# Patient Record
Sex: Male | Born: 1955
Health system: Southern US, Community
[De-identification: ages and names within clinical notes are randomized; demographics above are authoritative.]

## PROBLEM LIST (undated history)

## (undated) HISTORY — PX: CHOLECYSTECTOMY: SHX55

---

## 2009-07-22 ENCOUNTER — Ambulatory Visit: Payer: Self-pay | Admitting: Family Medicine

## 2010-12-22 IMAGING — CR CERVICAL SPINE - COMPLETE 4+ VIEW
1 series · 7 of 7 positions shown · non-contrast
Comparison: none

REASON FOR EXAM: parathesia  lt hand tingling
COMMENTS:

[Series 1: view not recorded · 0.17mm/px · 7 of 7 slices shown]
[im 1/7]
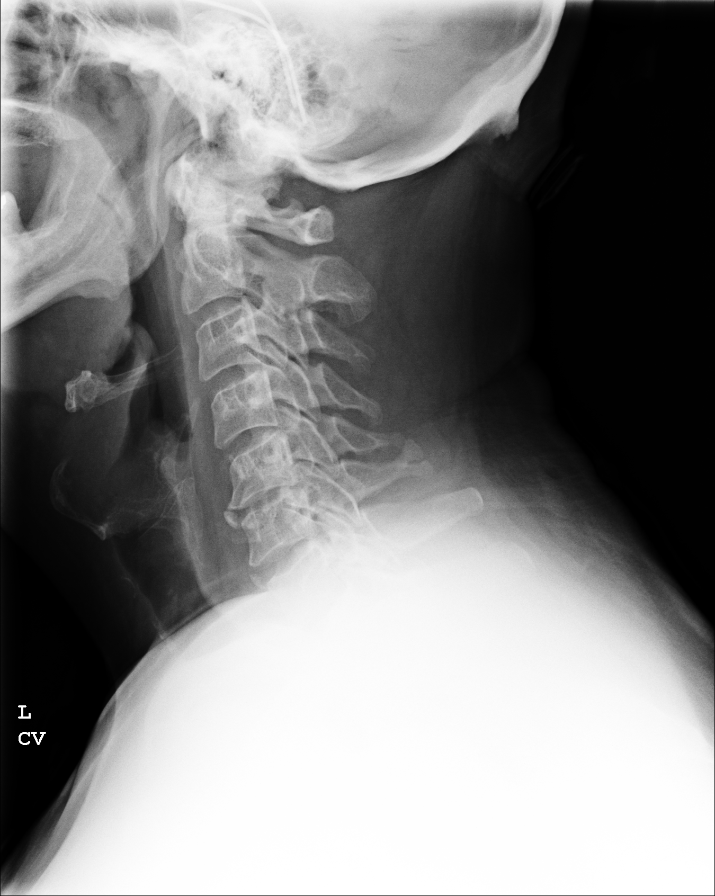
[im 2/7]
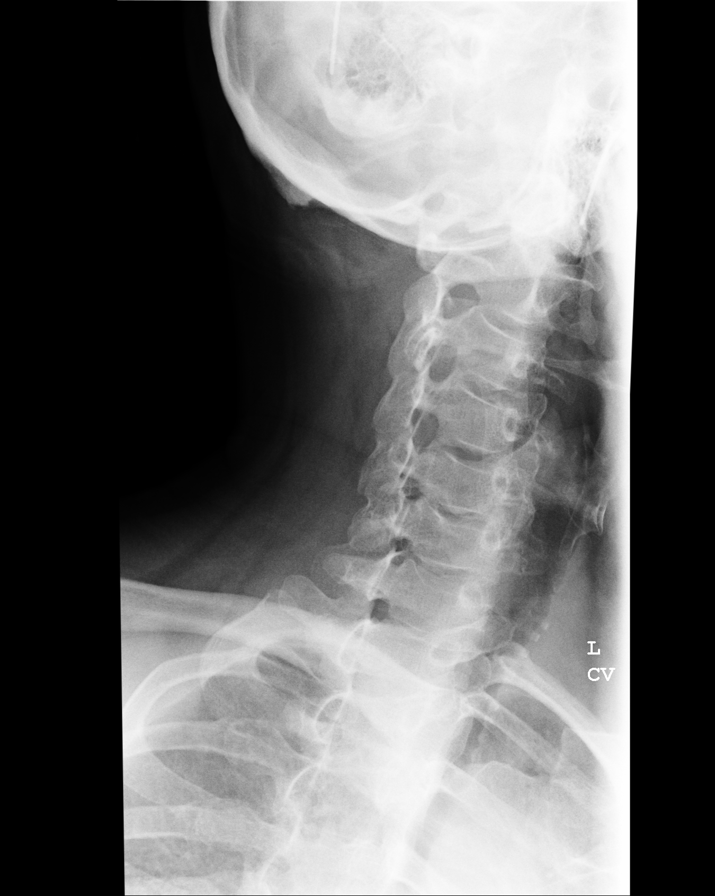
[im 3/7]
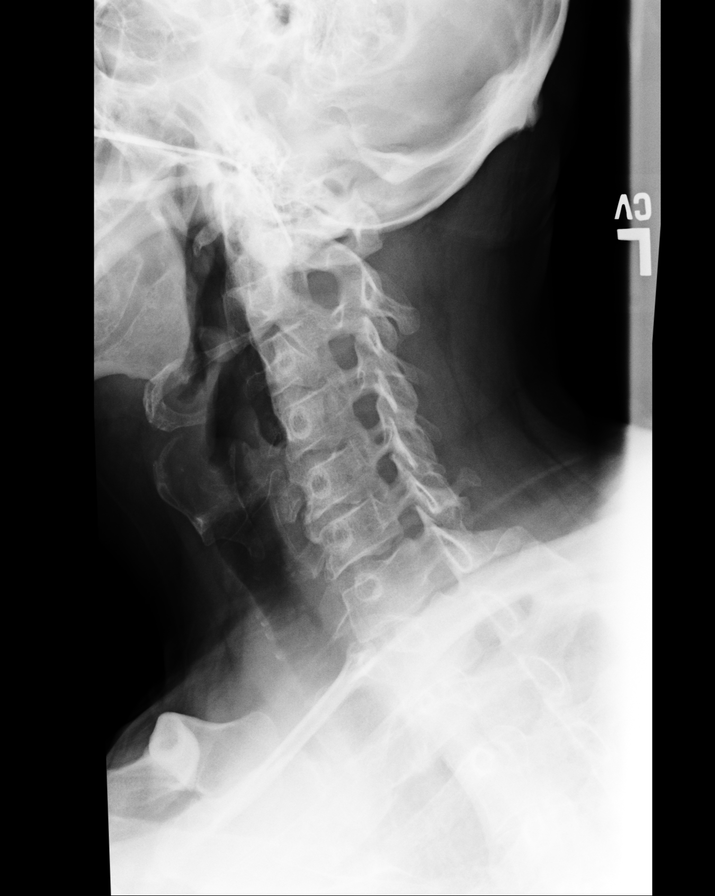
[im 4/7]
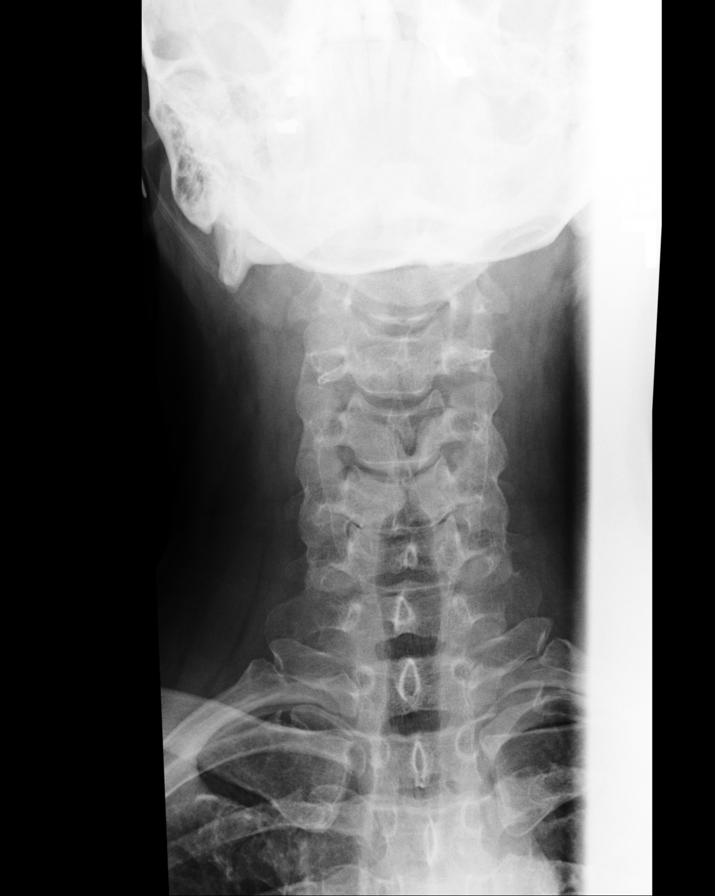
[im 5/7]
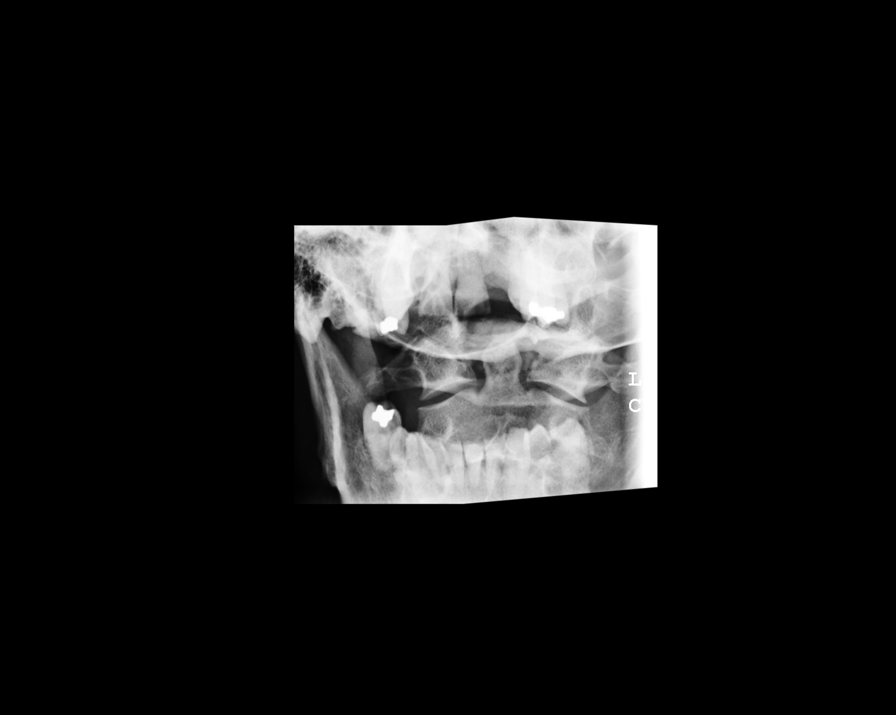
[im 6/7]
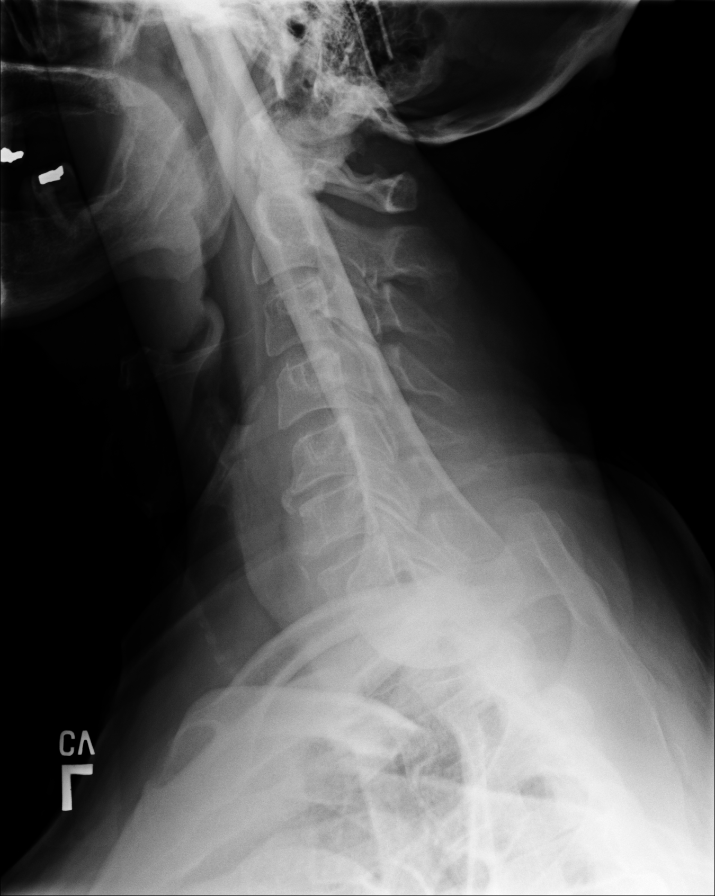
[im 7/7]
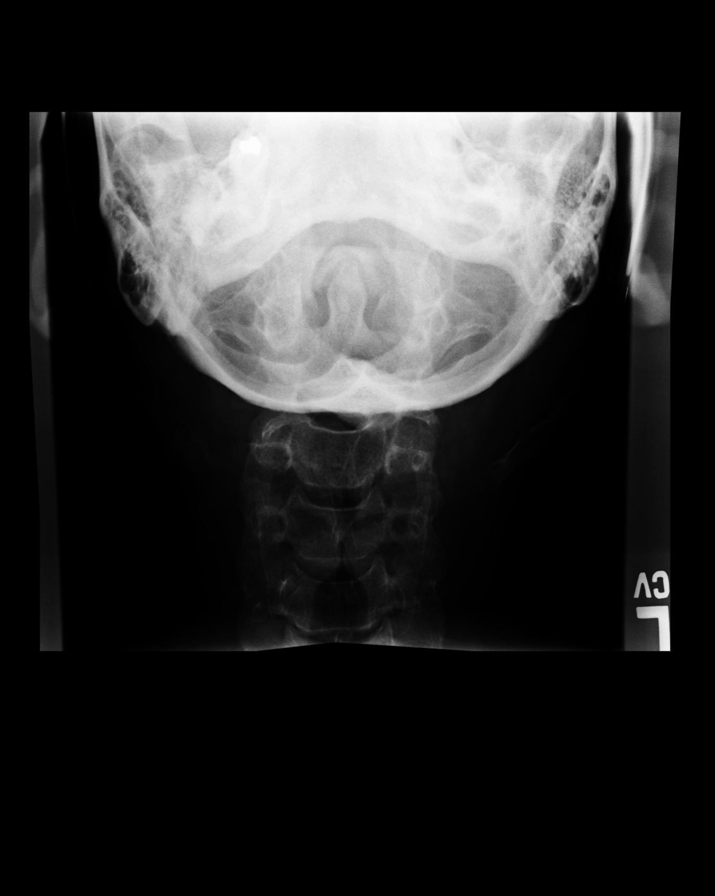

[7 of 7 positions shown; findings below may reference images not displayed]

PROCEDURE:     MDR - MDR CERVICAL SPINE COMPLETE  - July 22, 2009 [DATE]

RESULT:     The cervical vertebral bodies are preserved in height. There is
mild disc space narrowing at C5-C6 and at C6-C7. There are small anterior
osteophytes at these levels. The oblique views reveal no high-grade bony
encroachment upon the neural foramina. The prevertebral soft tissue spaces
are normal. The spinous processes are intact. The lateral masses of C1 align
normally with those of C2. The odontoid is intact.
IMPRESSION: There is mild degenerative disc change at C5-C6 and to a
lesser extent at C6-C7. I do not see evidence of acute fracture nor
dislocation.

## 2011-10-22 ENCOUNTER — Ambulatory Visit: Payer: Self-pay | Admitting: Family Medicine

## 2012-05-02 ENCOUNTER — Ambulatory Visit: Payer: Self-pay | Admitting: Family Medicine

## 2013-03-23 IMAGING — CR DG LUMBAR SPINE COMPLETE 4+V
1 series · 5 of 5 positions shown · non-contrast
Comparison: none

REASON FOR EXAM: lower back pain
COMMENTS:

[Series 1: ap · 0.17mm/px · 5 of 5 slices shown]
[im 1/5]
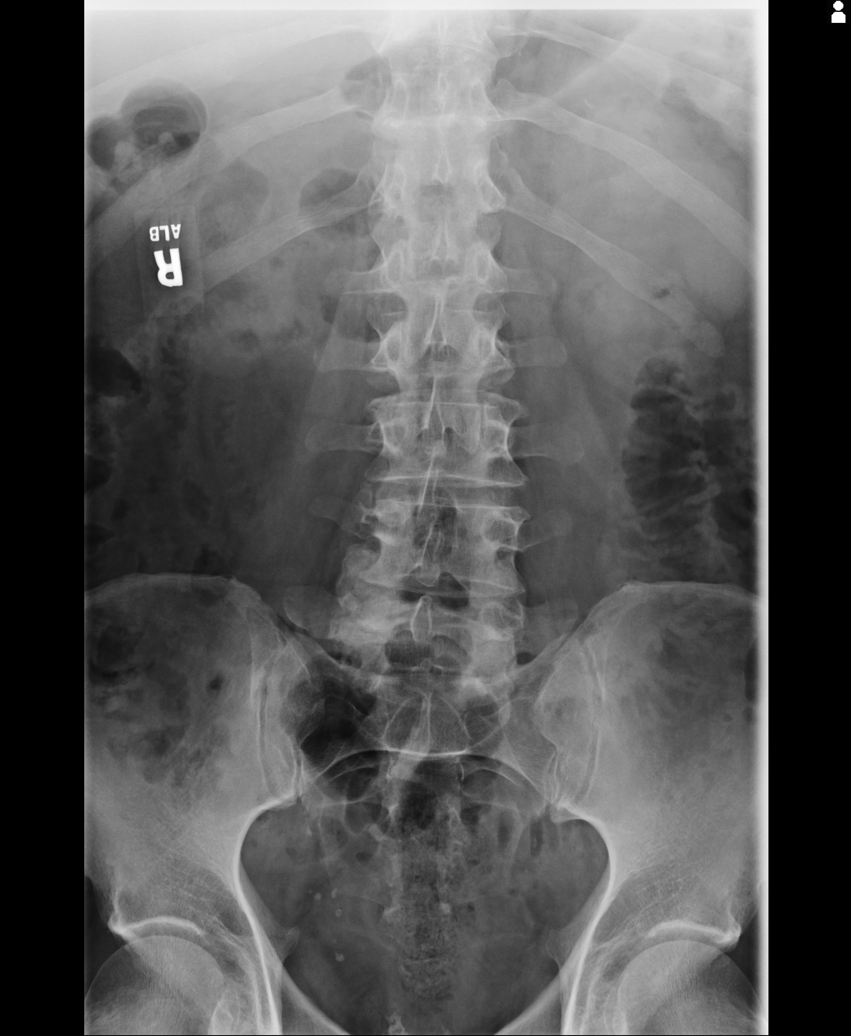
[im 2/5]
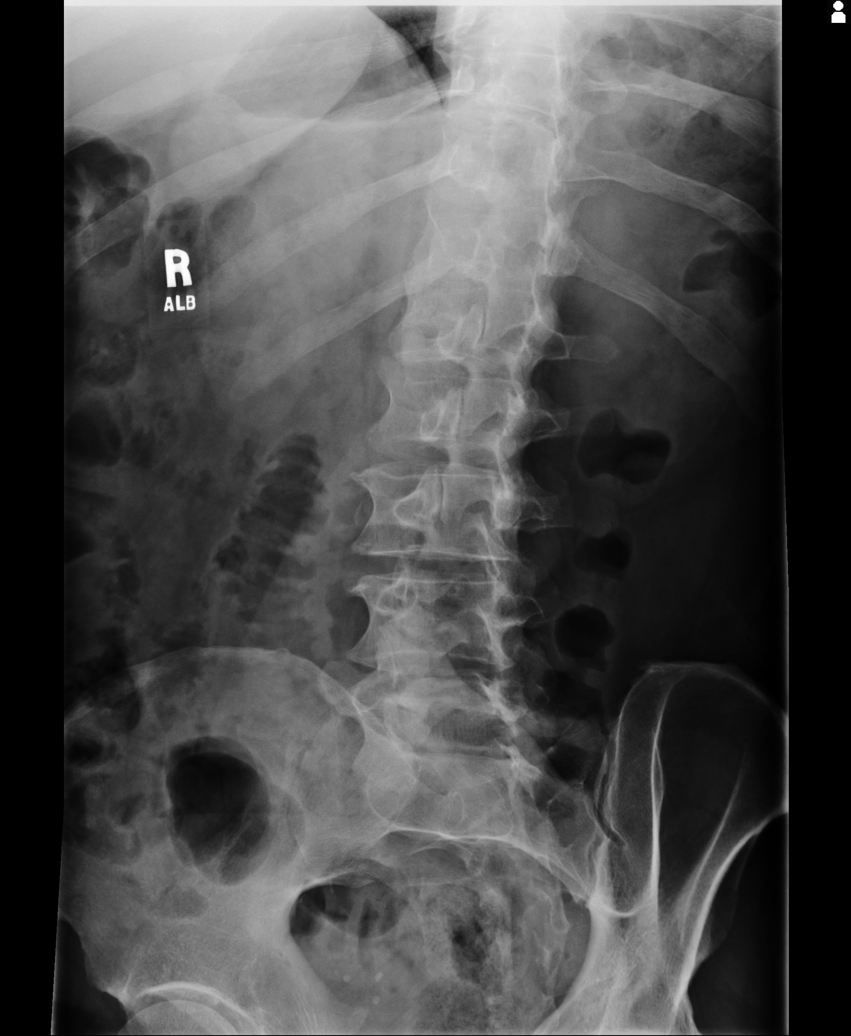
[im 3/5]
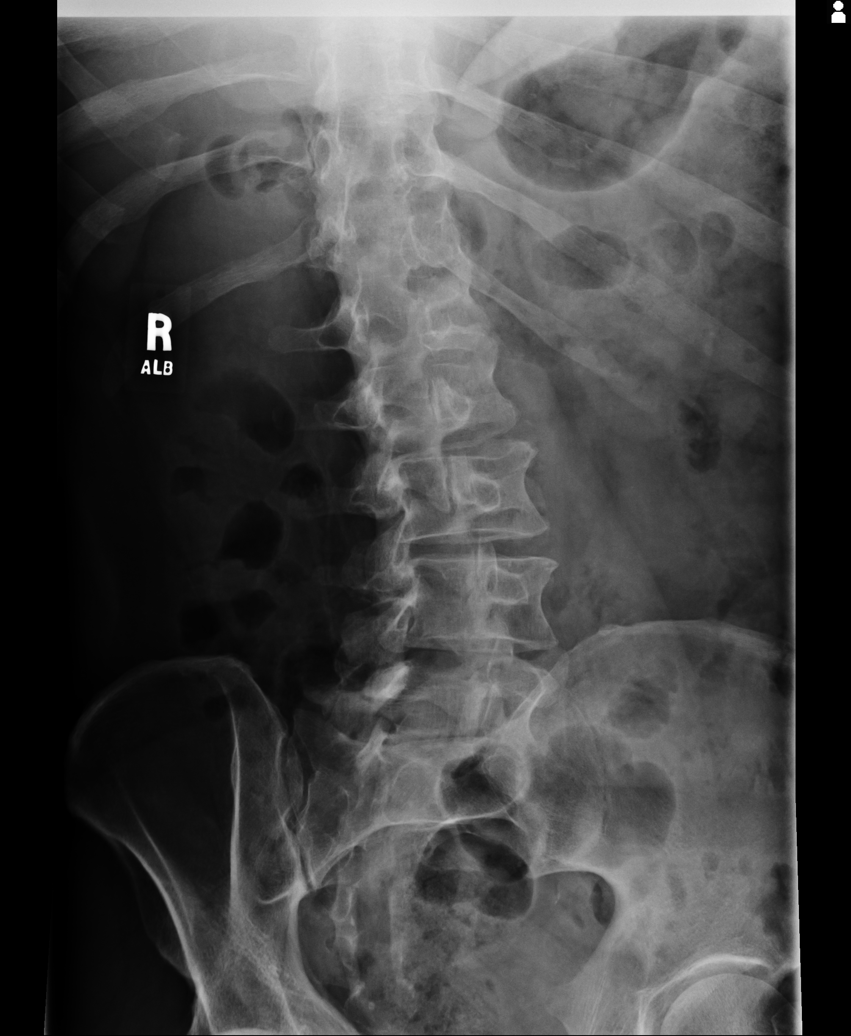
[im 4/5]
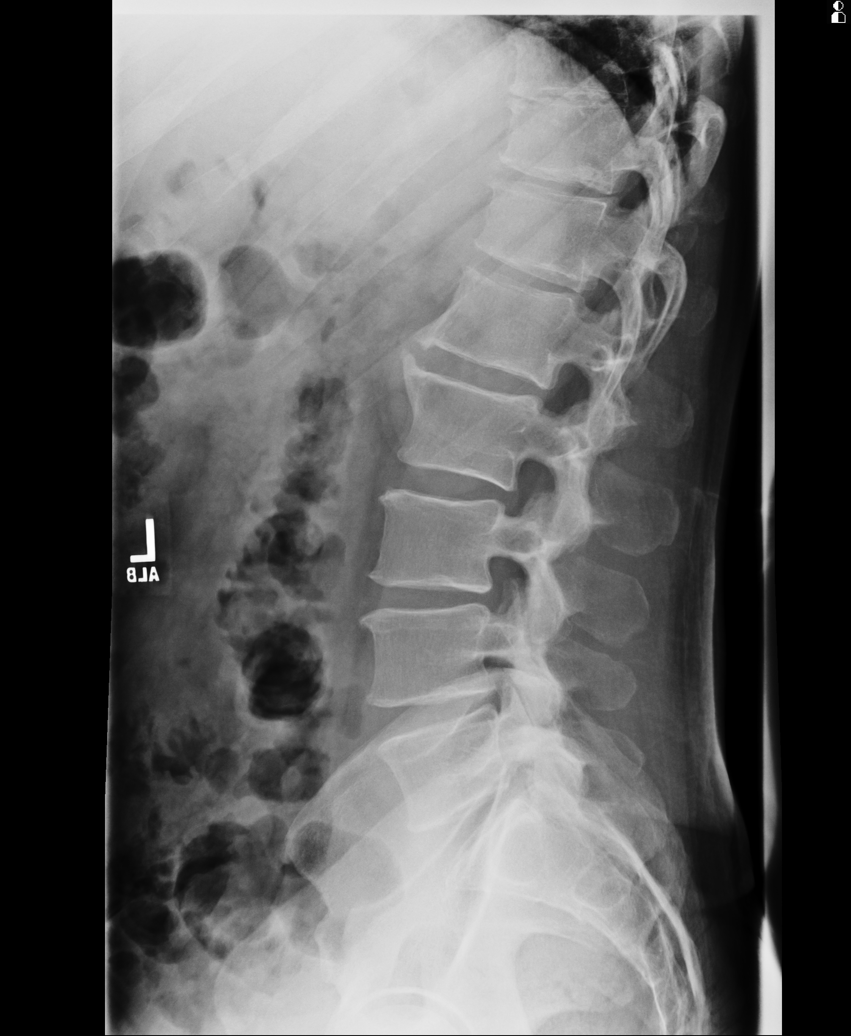
[im 5/5]
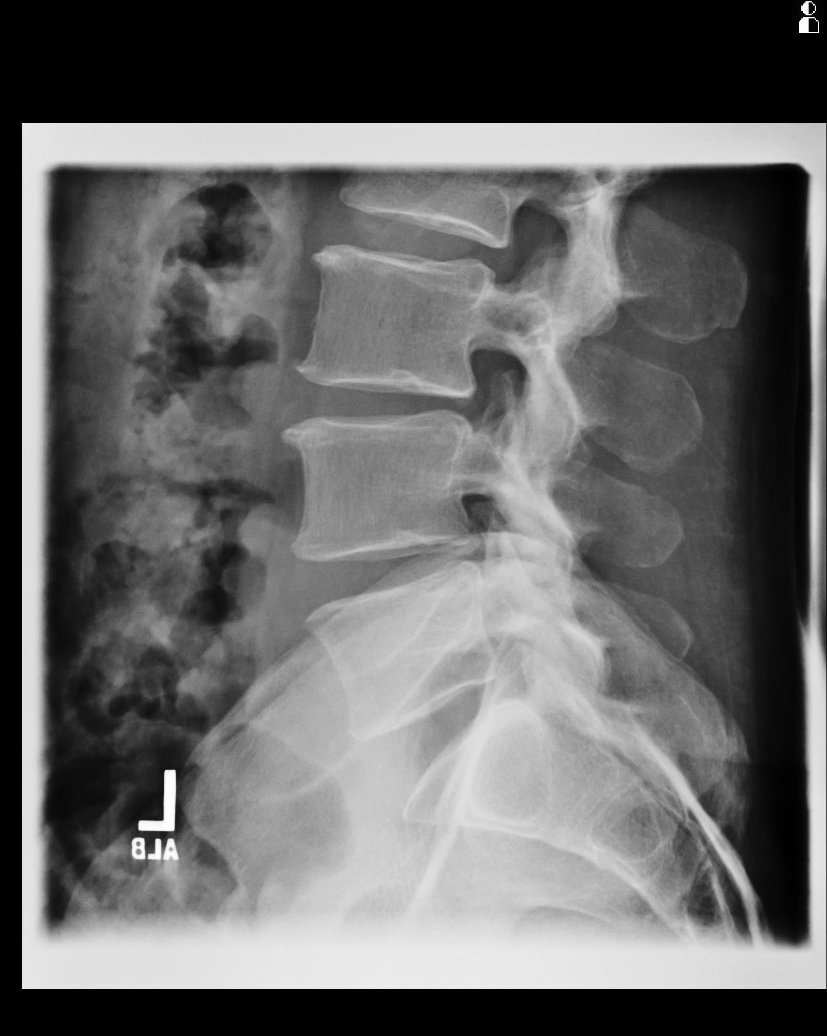

[5 of 5 positions shown; findings below may reference images not displayed]

PROCEDURE:     MDR - M[REDACTED] SPINE WITH OBLIQUES  - October 22, 2011 [DATE]

RESULT:     The vertebral body heights and the intervertebral disc spaces
are well maintained. The vertebral body alignment is normal. Oblique views
show no significant abnormalities of the articular facets. The pedicles are
bilaterally intact. There is a slight lumbar scoliosis with the convexity to
the left and measuring approximately 10 degrees.
IMPRESSION: 1.  No fracture or other acute bony abnormality is identified.
2.  No definite disc space narrowing is seen.
3.  There is a very slight lumbar scoliosis with convexly to the left and
which measures approximately 10 degrees.

## 2013-10-02 IMAGING — CR CERVICAL SPINE - COMPLETE 4+ VIEW
1 series · 6 of 6 positions shown · non-contrast
Comparison: none

REASON FOR EXAM: neck pain radiating down lt arm
COMMENTS:

PROCEDURE:     MDR - MDR CERVICAL SPINE COMPLETE  - May 02, 2012  [DATE]
RESULT:     Comparison: 07/22/2009

[Series 1: lat · 0.17mm/px · 6 of 6 slices shown]
[im 1/6]
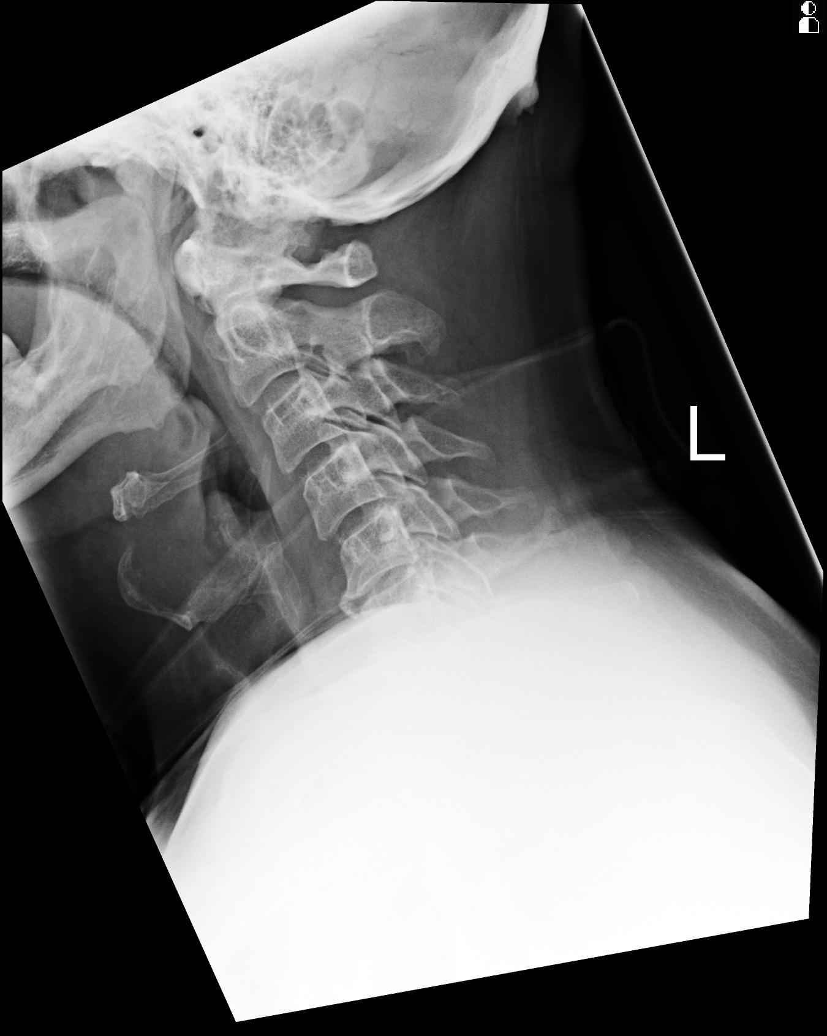
[im 2/6]
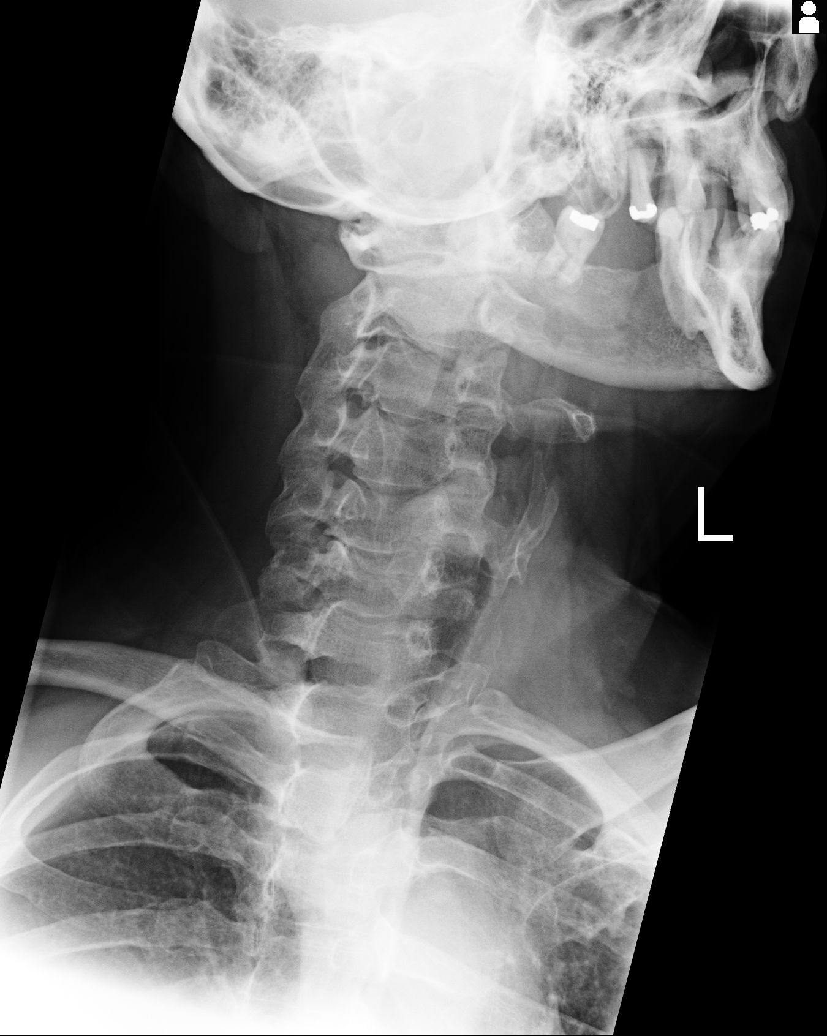
[im 3/6]
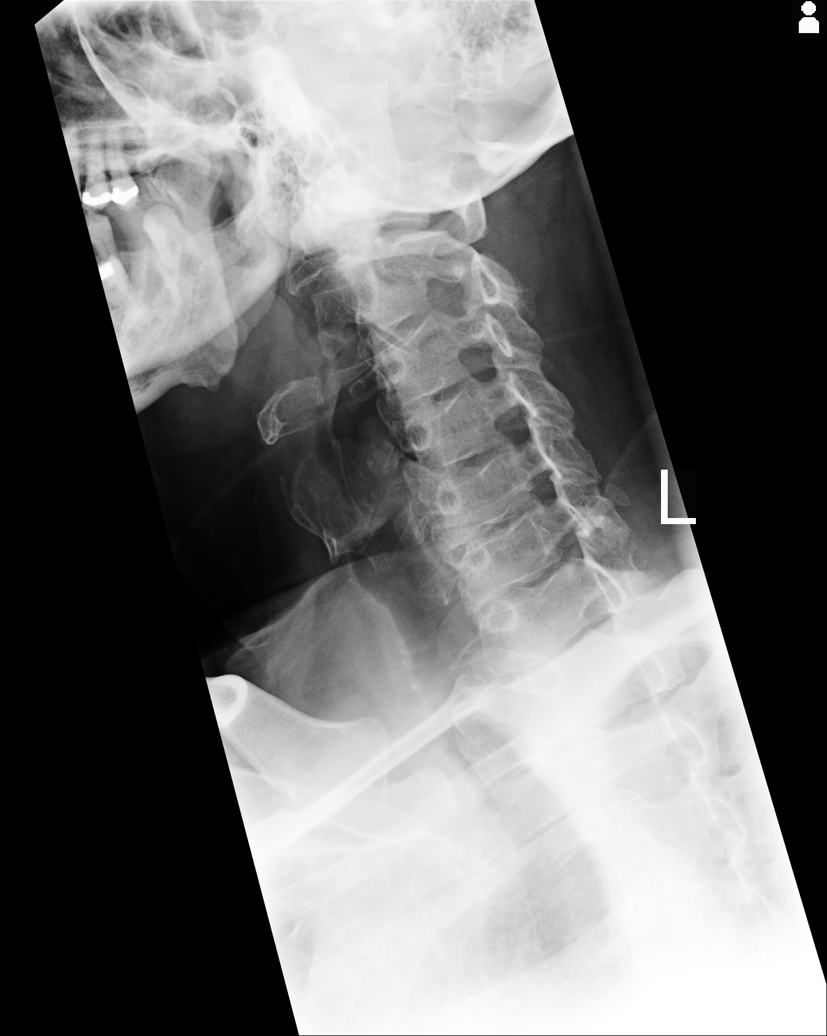
[im 4/6]
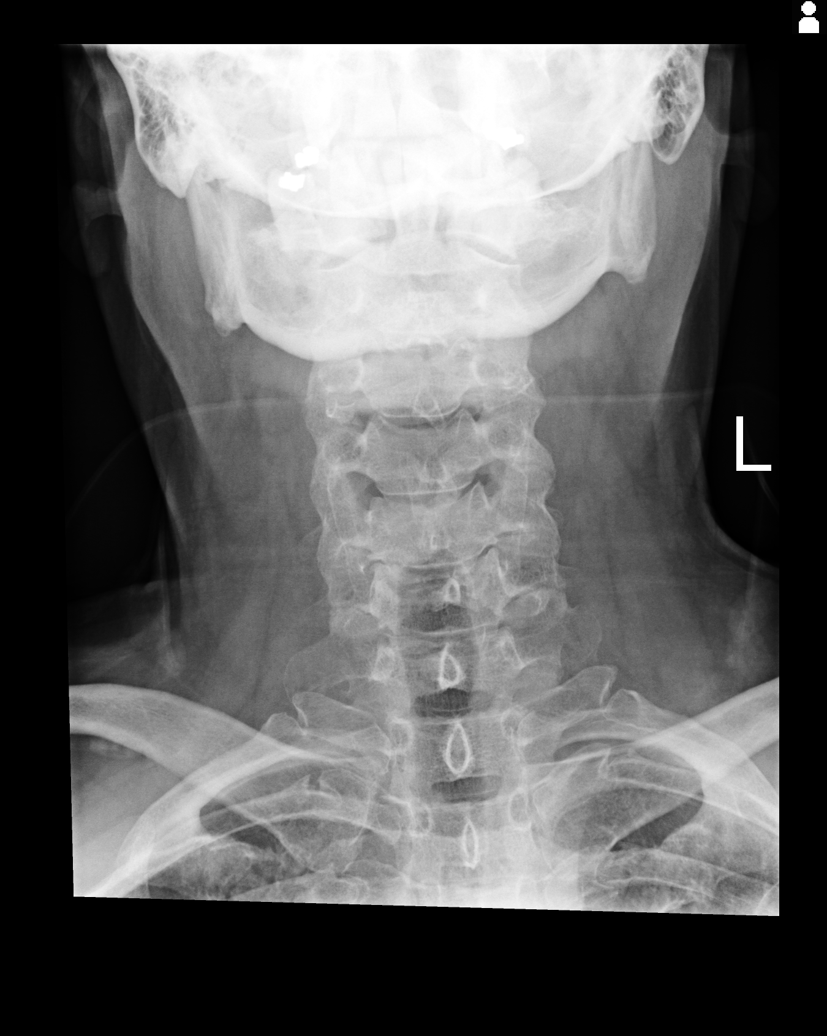
[im 5/6]
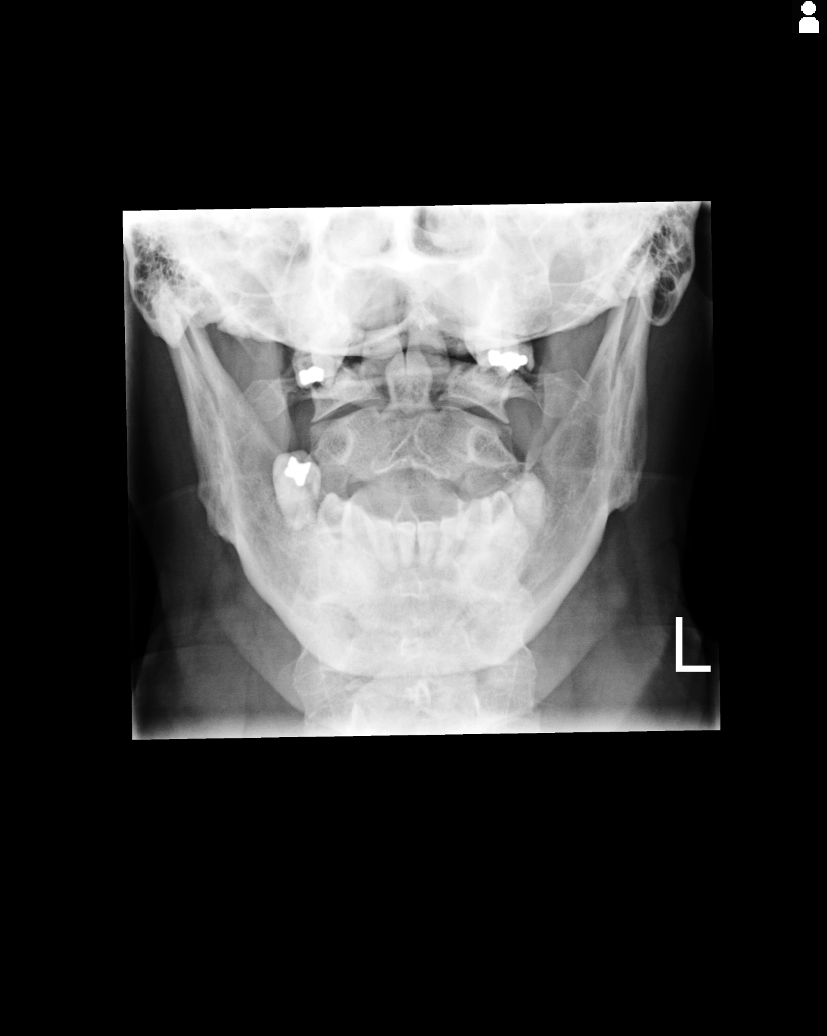
[im 6/6]
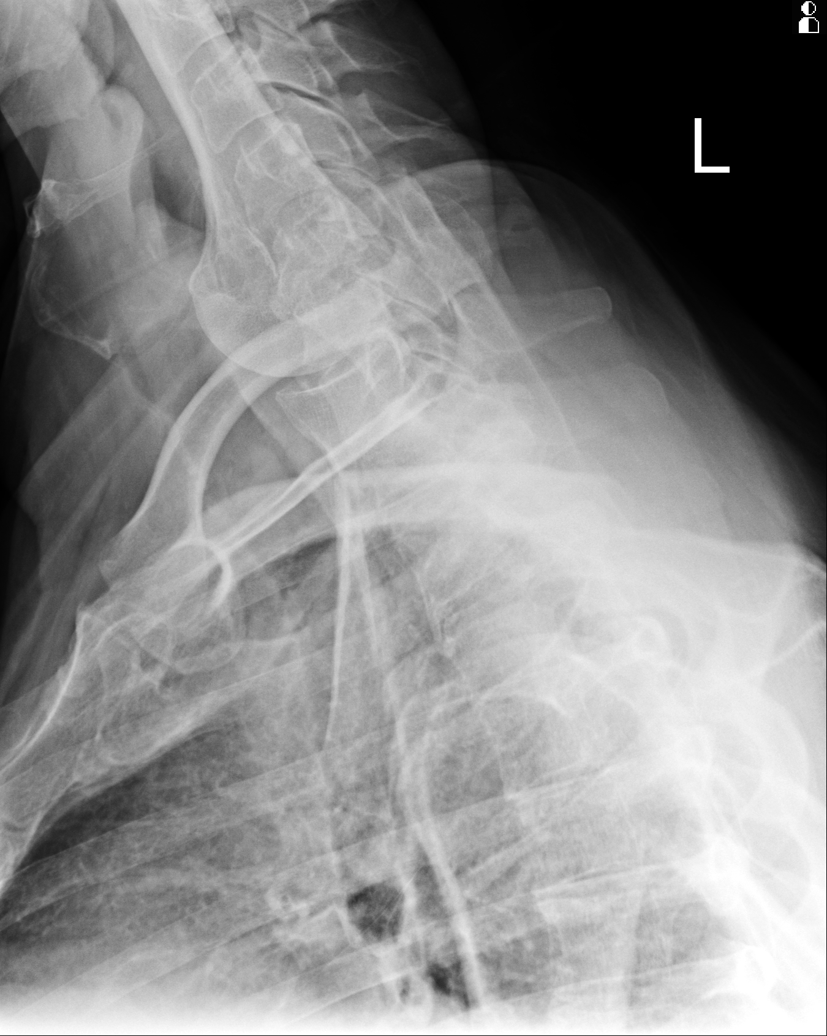

[6 of 6 positions shown; findings below may reference images not displayed]

FINDINGS: The cervical spine is imaged from the skull base through C6. C7 is poorly
visualized on the swimmer's view. There is normal alignment in the
visualized cervical spine. Intervertebral disc height loss and osteophytosis
at C5-C6 is similar to prior. The prevertebral soft tissues are within
normal limits. No definite neuroforaminal narrowing. There is uncovertebral
hypertrophy at C5-C6.
IMPRESSION: Degenerative disc disease at C5-C6, similar to prior. Please note, C7 is not
well-seen.

[REDACTED]

## 2016-03-31 ENCOUNTER — Encounter: Payer: Self-pay | Admitting: Emergency Medicine

## 2016-03-31 ENCOUNTER — Ambulatory Visit
Admission: EM | Admit: 2016-03-31 | Discharge: 2016-03-31 | Disposition: A | Discharge status: Home / Self Care | Payer: Managed Care, Other (non HMO) | Attending: Family Medicine | Admitting: Family Medicine

## 2016-03-31 DIAGNOSIS — J0110 Acute frontal sinusitis, unspecified: Secondary | ICD-10-CM | POA: Diagnosis not present

## 2016-03-31 MED ORDER — AMOXICILLIN-POT CLAVULANATE 875-125 MG PO TABS: 1.0000 | ORAL_TABLET | Freq: Two times a day (BID) | ORAL | 0 refills | Status: DC

## 2016-03-31 NOTE — ED Provider Notes (Signed)
[facility] ____________________________________________  Time seen: Approximately 12:29 PM  I have reviewed the triage vital signs and the nursing notes.   HISTORY  Chief Complaint Facial Pain  HPI Shaun ArdRandy Lee Vang is a 60 y.o. male presents with a complaint of 2 weeks of nasal congestion and sinus congestion. Patient reports for the last week he feels like he has had pressure in his sinuses and sinuses feel clogged in his cheeks and low forehead. Patient reports he feels like he needs to blow his nose and get the drainage out but states the drainage is thick and he is not able to get a lot out. Patient denies any fevers. Denies cough. Reports does have history of seasonal allergies. Patient does report has a history of similar in the past with sinus infections.  Reports continues to eat and drink well. Reports over-the-counter Mucinex and congestion medicines help some. Denies fevers, chest pain, shortness of breath, abdominal pain, dysuria, neck pain, back pain, extremity pain, extremity swelling, dizziness, or vision changes. Denies known sick contacts.   History reviewed. No pertinent past medical history.  There are no active problems to display for this patient.   Past Surgical History:  Procedure Laterality Date  . CHOLECYSTECTOMY        No current facility-administered medications for this encounter.   Current Outpatient Prescriptions:  .  amoxicillin-clavulanate (AUGMENTIN) 875-125 MG tablet, Take 1 tablet by mouth every 12 (twelve) hours., Disp: 20 tablet, Rfl: 0  Allergies Review of patient's allergies indicates no known allergies.  History reviewed. No pertinent family history.  Social History Social History  Substance Use Topics  . Smoking status: Current Every Day Smoker    Packs/day: 1.00    Types: Cigarettes  . Smokeless tobacco: Never Used  . Alcohol use No    Review of Systems Constitutional: No fever/chills Eyes: No visual  changes. ENT: No sore throat. As above. Cardiovascular: Denies chest pain. Respiratory: Denies shortness of breath. Gastrointestinal: No abdominal pain.  No nausea, no vomiting.  No diarrhea.  No constipation. Genitourinary: Negative for dysuria. Musculoskeletal: Negative for back pain. Skin: Negative for rash. Neurological: Negative for headaches, focal weakness or numbness.  10-point ROS otherwise negative.  ____________________________________________   PHYSICAL EXAM:  VITAL SIGNS: ED Triage Vitals  Enc Vitals Group     BP 03/31/16 1226 (!) 141/95     Pulse Rate 03/31/16 1226 (!) 102 Recheck 84     Resp 03/31/16 1226 16     Temp 03/31/16 1226 98.6 F (37 C)     Temp Source 03/31/16 1226 Oral     SpO2 03/31/16 1226 100 %     Weight 03/31/16 1224 189 lb (85.7 kg)     Height 03/31/16 1224 5\' 8"  (1.727 m)     Head Circumference --      Peak Flow --      Pain Score 03/31/16 1225 4     Pain Loc --      Pain Edu? --      Excl. in GC? --     Constitutional: Alert and oriented. Well appearing and in no acute distress. Eyes: Conjunctivae are normal. PERRL. EOMI. Head: Atraumatic.Mild tenderness to palpation bilateral frontal and maxillary sinuses. No swelling. No erythema.   Ears: no erythema, normal TMs bilaterally.   Nose: nasal congestion with bilateral nasal turbinate erythema and edema.   Mouth/Throat: Mucous membranes are moist.  Oropharynx non-erythematous.No tonsillar swelling or exudate.  Neck: No stridor.  No cervical spine  tenderness to palpation. Hematological/Lymphatic/Immunilogical: No cervical lymphadenopathy. Cardiovascular: Normal rate, regular rhythm. Grossly normal heart sounds.  Good peripheral circulation. Respiratory: Normal respiratory effort.  No retractions. Lungs CTAB. No wheezes, rales or rhonchi. Good air movement.  Gastrointestinal: Soft and nontender.  No CVA tenderness. Musculoskeletal: No lower or upper extremity tenderness nor edema.  No  cervical, thoracic or lumbar tenderness to palpation.   Neurologic:  Normal speech and language. No gross focal neurologic deficits are appreciated. No gait instability. Skin:  Skin is warm, dry and intact. No rash noted. Psychiatric: Mood and affect are normal. Speech and behavior are normal.  ___________________________________________   LABS (all labs ordered are listed, but only abnormal results are displayed)  Labs Reviewed - No data to display ____________________________________________   PROCEDURES Procedures    INITIAL IMPRESSION / ASSESSMENT AND PLAN / ED COURSE  Pertinent labs & imaging results that were available during my care of the patient were reviewed by me and considered in my medical decision making (see chart for details).  Very well-appearing patient. No distress. Presents for the complaints of over 1 week of nasal congestion and sinus congestion. History of sinusitis in the past. Suspect sinusitis. Will treat patient with oral Augmentin. Encouraged supportive care and over-the-counter supportive care encouraged rest, fluids and PCP follow up. Discussed strict follow-up and return parameters.Discussed indication, risks and benefits of medications with patient.  Discussed follow up with Primary care physician this week. Discussed follow up and return parameters including no resolution or any worsening concerns. Patient verbalized understanding and agreed to plan.   ____________________________________________   FINAL CLINICAL IMPRESSION(S) / ED DIAGNOSES  Final diagnoses:  Acute frontal sinusitis, recurrence not specified     Discharge Medication List as of 03/31/2016 12:34 PM    START taking these medications   Details  amoxicillin-clavulanate (AUGMENTIN) 875-125 MG tablet Take 1 tablet by mouth every 12 (twelve) hours., Starting Tue 03/31/2016, Normal        Note: This dictation was prepared with Dragon dictation along with smaller phrase technology.  Any transcriptional errors that result from this process are unintentional.    Clinical Course      Renford Dills, NP 03/31/16 1313

## 2016-03-31 NOTE — Discharge Instructions (Signed)
Take medication as prescribed. Rest. Drink plenty of fluids.  ° °Follow up with your primary care physician this week as needed. Return to Urgent care for new or worsening concerns.  ° °

## 2016-03-31 NOTE — ED Triage Notes (Signed)
Patient c/o sinus pressure and congestion, stuffy nose for a week.  Patient denies fevers.

## 2017-11-19 ENCOUNTER — Encounter: Payer: Self-pay | Admitting: Emergency Medicine

## 2017-11-19 ENCOUNTER — Other Ambulatory Visit: Payer: Self-pay

## 2017-11-19 ENCOUNTER — Ambulatory Visit
Admission: EM | Admit: 2017-11-19 | Discharge: 2017-11-19 | Disposition: A | Discharge status: Home / Self Care | Payer: Managed Care, Other (non HMO) | Attending: Family Medicine | Admitting: Family Medicine

## 2017-11-19 DIAGNOSIS — R05 Cough: Secondary | ICD-10-CM

## 2017-11-19 DIAGNOSIS — R059 Cough, unspecified: Secondary | ICD-10-CM

## 2017-11-19 DIAGNOSIS — J0101 Acute recurrent maxillary sinusitis: Secondary | ICD-10-CM

## 2017-11-19 MED ORDER — AMOXICILLIN-POT CLAVULANATE 875-125 MG PO TABS: 1.0000 | ORAL_TABLET | Freq: Two times a day (BID) | ORAL | 0 refills | Status: DC

## 2017-11-19 MED ORDER — FLUTICASONE PROPIONATE 50 MCG/ACT NA SUSP: 2.0000 | Freq: Every day | NASAL | 0 refills | Status: DC

## 2017-11-19 MED ORDER — HYDROCOD POLST-CPM POLST ER 10-8 MG/5ML PO SUER: 5.0000 mL | Freq: Two times a day (BID) | ORAL | 0 refills | Status: DC

## 2017-11-19 NOTE — ED Triage Notes (Signed)
Patient in tonight c/o 2 week history of nasal congestion, sinus pressure and pain, sore throat and fatigue. Patient denies fever, but has had sweats. Patient has tried OTC decongestant without relief.

## 2017-11-19 NOTE — ED Provider Notes (Signed)
[facility]    CSN: [id number] Arrival date & time: 11/19/17  1803     History   Chief Complaint Chief Complaint  Patient presents with  . Nasal Congestion    HPI Shaun Vang is a 62 y.o. male.   HPI   62 year old male presents with a 2-week history of nasal congestion sinus pressure and pain sore throat and fatigue.  He has had no fevers but has experienced some sweats.  He has tried over-the-counter decongestant without relief.  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  Says that he will usually have recurrent sinusitis problems.  He has last one was recorded in 2017.  Tries to wait as long as possible but this time was not improving so he came in today.       History reviewed. No pertinent past medical history.  There are no active problems to display for this patient.   Past Surgical History:  Procedure Laterality Date  . CHOLECYSTECTOMY         Home Medications    Prior to Admission medications   Medication Sig Start Date End Date Taking? Authorizing Provider  amoxicillin-clavulanate (AUGMENTIN) 875-125 MG tablet Take 1 tablet by mouth every 12 (twelve) hours. 11/19/17   Lutricia Feiloemer, Debie Ashline P, PA-C  chlorpheniramine-HYDROcodone (TUSSIONEX PENNKINETIC ER) 10-8 MG/5ML SUER Take 5 mLs by mouth 2 (two) times daily. 11/19/17   Lutricia Feiloemer, Khaya Theissen P, PA-C  fluticasone (FLONASE) 50 MCG/ACT nasal spray Place 2 sprays into both nostrils daily. 11/19/17   Lutricia Feiloemer, Hersel Mcmeen P, PA-C    Family History Family History  Problem Relation Age of Onset  . Hypertension Mother   . Prostate cancer Father     Social History Social History   Tobacco Use  . Smoking status: Current Every Day Smoker    Packs/day: 1.00    Types: Cigarettes  . Smokeless tobacco: Never Used  Substance Use Topics  . Alcohol use: No  . Drug use: No     Allergies   Patient has no known allergies.   Review of Systems Review of Systems  Constitutional: Positive  for activity change and chills. Negative for fatigue and fever.  HENT: Positive for congestion, postnasal drip, rhinorrhea, sinus pressure, sinus pain and sore throat.   Respiratory: Positive for cough.   All other systems reviewed and are negative.    Physical Exam Triage Vital Signs ED Triage Vitals  Enc Vitals Group     BP 11/19/17 1813 (!) 170/88     Pulse Rate 11/19/17 1813 82     Resp 11/19/17 1813 16     Temp 11/19/17 1813 98.2 F (36.8 C)     Temp Source 11/19/17 1813 Oral     SpO2 11/19/17 1813 100 %     Weight 11/19/17 1813 189 lb (85.7 kg)     Height 11/19/17 1813 5\' 9"  (1.753 m)     Head Circumference --      Peak Flow --      Pain Score 11/19/17 1814 0     Pain Loc --      Pain Edu? --      Excl. in GC? --    No data found.  Updated Vital Signs BP (!) 170/88 (BP Location: Left Arm)   Pulse 82   Temp 98.2 F (36.8 C) (Oral)   Resp 16   Ht 5\' 9"  (1.753 m)   Wt 189 lb (85.7 kg)   SpO2 100%   BMI 27.91  kg/m   Visual Acuity Right Eye Distance:   Left Eye Distance:   Bilateral Distance:    Right Eye Near:   Left Eye Near:    Bilateral Near:     Physical Exam  Constitutional: He is oriented to person, place, and time. He appears well-developed and well-nourished. No distress.  HENT:  Head: Normocephalic and atraumatic.  Right Ear: External ear normal.  Left Ear: External ear normal.  Nose: Nose normal.  Mouth/Throat: Oropharynx is clear and moist. No oropharyngeal exudate.  Patient has tenderness over the maxillary sinuses to percussion  Eyes: Pupils are equal, round, and reactive to light. Right eye exhibits no discharge. Left eye exhibits no discharge.  Neck: Normal range of motion.  Pulmonary/Chest: Effort normal and breath sounds normal.  Musculoskeletal: Normal range of motion.  Lymphadenopathy:    He has no cervical adenopathy.  Neurological: He is alert and oriented to person, place, and time.  Skin: Skin is warm and dry. He is not  diaphoretic.  Psychiatric: He has a normal mood and affect. His behavior is normal. Judgment and thought content normal.  Nursing note and vitals reviewed.    UC Treatments / Results  Labs (all labs ordered are listed, but only abnormal results are displayed) Labs Reviewed - No data to display  EKG None Radiology No results found.  Procedures Procedures (including critical care time)  Medications Ordered in UC Medications - No data to display   Initial Impression / Assessment and Plan / UC Course  I have reviewed the triage vital signs and the nursing notes.  Pertinent labs & imaging results that were available during my care of the patient were reviewed by me and considered in my medical decision making (see chart for details).     Plan: 1. Test/x-ray results and diagnosis reviewed with patient 2. rx as per orders; risks, benefits, potential side effects reviewed with patient 3. Recommend supportive treatment with Flonase on a daily basis. Since He has had this recurrent and is been 2 weeks in duration at this point we will start him on Augmentin.  Because of his cough particular at nighttime I will give him Tussionex.  Not improving he should return to [place] or to his primary care physician. 4. F/u prn if symptoms worsen or don't improve   Final Clinical Impressions(s) / UC Diagnoses   Final diagnoses:  Acute recurrent maxillary sinusitis  Cough    ED Discharge Orders        Ordered    amoxicillin-clavulanate (AUGMENTIN) 875-125 MG tablet  Every 12 hours     11/19/17 1832    fluticasone (FLONASE) 50 MCG/ACT nasal spray  Daily     11/19/17 1832    chlorpheniramine-HYDROcodone (TUSSIONEX PENNKINETIC ER) 10-8 MG/5ML SUER  2 times daily     11/19/17 1832       Controlled Substance Prescriptions [place] Controlled Substance Registry consulted? Not Applicable   Lutricia Feil, PA-C 11/19/17 1844

## 2021-05-10 ENCOUNTER — Other Ambulatory Visit: Payer: Self-pay

## 2021-05-10 ENCOUNTER — Ambulatory Visit
Admission: RE | Admit: 2021-05-10 | Discharge: 2021-05-10 | Disposition: A | Discharge status: Home / Self Care | Payer: Medicare Other | Source: Ambulatory Visit | Attending: Physician Assistant | Admitting: Physician Assistant

## 2021-05-10 VITALS — BP 162/87 | HR 58 | Temp 98.2°F | Resp 16 | Ht 68.0 in | Wt 183.0 lb

## 2021-05-10 DIAGNOSIS — J019 Acute sinusitis, unspecified: Secondary | ICD-10-CM | POA: Diagnosis not present

## 2021-05-10 MED ORDER — AMOXICILLIN-POT CLAVULANATE 875-125 MG PO TABS: 1.0000 | ORAL_TABLET | Freq: Two times a day (BID) | ORAL | 0 refills | Status: AC

## 2021-05-10 MED ORDER — LORATADINE 10 MG PO TABS: 10.0000 mg | ORAL_TABLET | Freq: Every day | ORAL | 0 refills | Status: AC

## 2021-05-10 NOTE — ED Triage Notes (Signed)
Pt with two weeks of facial pain and pressure. Pain across forehead. Head feels congested but no nasal drainage

## 2021-05-10 NOTE — Discharge Instructions (Addendum)
-  Symptoms are consistent with sinusitis but more likely due to allergies.  I advised to start Claritin and use Flonase every day.  Also consider use of nasal saline rinses. -If no improvement in the next week or for any worsening symptoms start the Augmentin and complete full course. -Blood pressure is very high today.  Try to keep a log of your blood pressures and follow-up with your PCP as soon as possible.  As we discussed everyone needs a PCP and to have routine labs done.  Blood pressure is something that can be controlled well with certain lifestyle changes and medications but if uncontrolled can lead to a lot of issues including heart attack and stroke.  [facility] at [facility], [address], [address]. Phone number: [phone] Please call this number to establish with primary care

## 2021-05-10 NOTE — ED Provider Notes (Signed)
[facility]    CSN: [id number] Arrival date & time: 05/10/21  1040      History   Chief Complaint Chief Complaint  Patient presents with   Facial Pain    HPI Shaun Vang is a 65 y.o. male presenting for 2-week history of frontal and maxillary bilateral sinus pressure and pain and nasal congestion.  Patient denies any fever, fatigue, cough, sore throat, chest pain, breathing difficulty, vomiting or diarrhea.  No sick contacts.  History of allergies.  Says this happens around season change but it has also happened during the summer months too.  Patient has been taking Sudafed without improvement or relief of his symptoms.  Patient believes he has a sinus infection.  No other complaints.  HPI  History reviewed. No pertinent past medical history.  There are no problems to display for this patient.   Past Surgical History:  Procedure Laterality Date   CHOLECYSTECTOMY         Home Medications    Prior to Admission medications   Medication Sig Start Date End Date Taking? Authorizing Provider  amoxicillin-clavulanate (AUGMENTIN) 875-125 MG tablet Take 1 tablet by mouth every 12 (twelve) hours for 7 days. 05/10/21 05/17/21 Yes Shirlee Latch, PA-C  loratadine (CLARITIN) 10 MG tablet Take 1 tablet (10 mg total) by mouth daily. 05/10/21 06/09/21 Yes Shirlee Latch, PA-C    Family History Family History  Problem Relation Age of Onset   Hypertension Mother    Prostate cancer Father     Social History Social History   Tobacco Use   Smoking status: Every Day    Packs/day: 1.00    Types: Cigarettes   Smokeless tobacco: Never  Vaping Use   Vaping Use: Never used  Substance Use Topics   Alcohol use: No   Drug use: No     Allergies   Patient has no known allergies.   Review of Systems Review of Systems  Constitutional:  Negative for fatigue and fever.  HENT:  Positive for congestion, sinus pressure and sinus pain. Negative for rhinorrhea and  sore throat.   Respiratory:  Negative for cough and shortness of breath.   Cardiovascular:  Negative for chest pain.  Gastrointestinal:  Negative for abdominal pain, diarrhea, nausea and vomiting.  Musculoskeletal:  Negative for myalgias.  Neurological:  Negative for weakness, light-headedness and headaches.  Hematological:  Negative for adenopathy.    Physical Exam Triage Vital Signs ED Triage Vitals  Enc Vitals Group     BP 05/10/21 1109 (!) 185/82     Pulse Rate 05/10/21 1109 65     Resp 05/10/21 1109 17     Temp 05/10/21 1109 98.2 F (36.8 C)     Temp Source 05/10/21 1109 Oral     SpO2 05/10/21 1109 100 %     Weight 05/10/21 1108 183 lb (83 kg)     Height 05/10/21 1108 5\' 8"  (1.727 m)     Head Circumference --      Peak Flow --      Pain Score 05/10/21 1107 8     Pain Loc --      Pain Edu? --      Excl. in GC? --    No data found.  Updated Vital Signs BP (!) 162/87 (BP Location: Left Arm)   Pulse (!) 58   Temp 98.2 F (36.8 C) (Oral)   Resp 16   Ht 5\' 8"  (1.727 m)   Wt 183 lb (83  kg)   SpO2 100%   BMI 27.83 kg/m    Physical Exam Vitals and nursing note reviewed.  Constitutional:      General: He is not in acute distress.    Appearance: Normal appearance. He is well-developed. He is not ill-appearing.  HENT:     Head: Normocephalic and atraumatic.     Right Ear: Tympanic membrane, ear canal and external ear normal.     Left Ear: Tympanic membrane, ear canal and external ear normal.     Nose: Congestion present.     Right Sinus: Maxillary sinus tenderness present.     Left Sinus: Maxillary sinus tenderness present.     Mouth/Throat:     Mouth: Mucous membranes are moist.     Pharynx: Oropharynx is clear.  Eyes:     General: No scleral icterus.    Conjunctiva/sclera: Conjunctivae normal.  Cardiovascular:     Rate and Rhythm: Normal rate and regular rhythm.     Heart sounds: Normal heart sounds.  Pulmonary:     Effort: Pulmonary effort is normal. No  respiratory distress.     Breath sounds: Normal breath sounds.  Musculoskeletal:     Cervical back: Neck supple.  Skin:    General: Skin is warm and dry.  Neurological:     General: No focal deficit present.     Mental Status: He is alert. Mental status is at baseline.     Motor: No weakness.     Coordination: Coordination normal.     Gait: Gait normal.  Psychiatric:        Mood and Affect: Mood normal.        Behavior: Behavior normal.        Thought Content: Thought content normal.     UC Treatments / Results  Labs (all labs ordered are listed, but only abnormal results are displayed) Labs Reviewed - No data to display  EKG   Radiology No results found.  Procedures Procedures (including critical care time)  Medications Ordered in UC Medications - No data to display  Initial Impression / Assessment and Plan / UC Course  I have reviewed the triage vital signs and the nursing notes.  Pertinent labs & imaging results that were available during my care of the patient were reviewed by me and considered in my medical decision making (see chart for details).  65 year old male presenting for 2-week history of sinus pressure and pain as well as nasal congestion.   Initial BP check 185/82.  Repeat check is 162/87.  Patient does not have a PCP.  Advised him that he needs to have a PCP so that he can be evaluated and have routine labs done and received a medication for his blood pressure.  I suggested [facility].  Reviewed that he needs to make lifestyle changes and see a PCP as uncontrolled hypertension can lead to heart attack and stroke.  Patient seems to understand.  Advised patient I do agree that he has a sinus infection but more likely allergies.  I have sent in Claritin advised him to use Flonase daily.  Also nasal saline rinses.  Advised him to start Augmentin if no improvement in the next week.  Reviewed increasing rest and fluids.  Follow-up here as  needed.   Final Clinical Impressions(s) / UC Diagnoses   Final diagnoses:  Acute sinusitis, recurrence not specified, unspecified location     Discharge Instructions      -Symptoms are consistent with sinusitis but more  likely due to allergies.  I advised to start Claritin and use Flonase every day.  Also consider use of nasal saline rinses. -If no improvement in the next week or for any worsening symptoms start the Augmentin and complete full course. -Blood pressure is very high today.  Try to keep a log of your blood pressures and follow-up with your PCP as soon as possible.  As we discussed everyone needs a PCP and to have routine labs done.  Blood pressure is something that can be controlled well with certain lifestyle changes and medications but if uncontrolled can lead to a lot of issues including heart attack and stroke.  [facility] at [facility], [address], [address]. Phone number: [phone] Please call this number to establish with primary care      ED Prescriptions     Medication Sig Dispense Auth. Provider   loratadine (CLARITIN) 10 MG tablet Take 1 tablet (10 mg total) by mouth daily. 30 tablet Eusebio Friendly B, PA-C   amoxicillin-clavulanate (AUGMENTIN) 875-125 MG tablet Take 1 tablet by mouth every 12 (twelve) hours for 7 days. 14 tablet Gareth Morgan      PDMP not reviewed this encounter.   Shirlee Latch, PA-C 05/10/21 1217

## 2021-05-13 DIAGNOSIS — R531 Weakness: Secondary | ICD-10-CM | POA: Diagnosis not present

## 2021-05-13 DIAGNOSIS — R0789 Other chest pain: Secondary | ICD-10-CM | POA: Diagnosis not present

## 2021-05-13 DIAGNOSIS — F17210 Nicotine dependence, cigarettes, uncomplicated: Secondary | ICD-10-CM | POA: Diagnosis not present

## 2021-05-13 DIAGNOSIS — I4510 Unspecified right bundle-branch block: Secondary | ICD-10-CM | POA: Diagnosis not present

## 2021-05-13 DIAGNOSIS — R200 Anesthesia of skin: Secondary | ICD-10-CM | POA: Diagnosis not present

## 2021-05-13 DIAGNOSIS — R519 Headache, unspecified: Secondary | ICD-10-CM | POA: Diagnosis not present

## 2021-05-13 DIAGNOSIS — R0981 Nasal congestion: Secondary | ICD-10-CM | POA: Diagnosis not present

## 2021-05-13 DIAGNOSIS — I10 Essential (primary) hypertension: Secondary | ICD-10-CM | POA: Diagnosis not present

## 2021-05-13 DIAGNOSIS — R0602 Shortness of breath: Secondary | ICD-10-CM | POA: Diagnosis not present

## 2021-05-13 DIAGNOSIS — R079 Chest pain, unspecified: Secondary | ICD-10-CM | POA: Diagnosis not present

## 2021-05-13 DIAGNOSIS — R9431 Abnormal electrocardiogram [ECG] [EKG]: Secondary | ICD-10-CM | POA: Diagnosis not present

## 2021-05-14 DIAGNOSIS — I10 Essential (primary) hypertension: Secondary | ICD-10-CM | POA: Diagnosis not present

## 2021-05-19 DIAGNOSIS — I10 Essential (primary) hypertension: Secondary | ICD-10-CM | POA: Diagnosis not present

## 2021-05-19 DIAGNOSIS — Z720 Tobacco use: Secondary | ICD-10-CM | POA: Diagnosis not present

## 2021-05-19 DIAGNOSIS — I4510 Unspecified right bundle-branch block: Secondary | ICD-10-CM | POA: Diagnosis not present

## 2021-05-19 DIAGNOSIS — F17200 Nicotine dependence, unspecified, uncomplicated: Secondary | ICD-10-CM | POA: Insufficient documentation

## 2021-05-19 DIAGNOSIS — R079 Chest pain, unspecified: Secondary | ICD-10-CM | POA: Diagnosis not present

## 2021-05-27 DIAGNOSIS — S199XXA Unspecified injury of neck, initial encounter: Secondary | ICD-10-CM | POA: Diagnosis not present

## 2021-05-27 DIAGNOSIS — R29703 NIHSS score 3: Secondary | ICD-10-CM | POA: Diagnosis not present

## 2021-05-27 DIAGNOSIS — R200 Anesthesia of skin: Secondary | ICD-10-CM | POA: Diagnosis not present

## 2021-05-27 DIAGNOSIS — I63511 Cerebral infarction due to unspecified occlusion or stenosis of right middle cerebral artery: Secondary | ICD-10-CM | POA: Diagnosis not present

## 2021-05-27 DIAGNOSIS — I6389 Other cerebral infarction: Secondary | ICD-10-CM | POA: Diagnosis not present

## 2021-05-27 DIAGNOSIS — F17210 Nicotine dependence, cigarettes, uncomplicated: Secondary | ICD-10-CM | POA: Diagnosis not present

## 2021-05-27 DIAGNOSIS — G44209 Tension-type headache, unspecified, not intractable: Secondary | ICD-10-CM | POA: Diagnosis not present

## 2021-05-27 DIAGNOSIS — G8194 Hemiplegia, unspecified affecting left nondominant side: Secondary | ICD-10-CM | POA: Diagnosis not present

## 2021-05-27 DIAGNOSIS — R531 Weakness: Secondary | ICD-10-CM | POA: Diagnosis not present

## 2021-05-27 DIAGNOSIS — G8324 Monoplegia of upper limb affecting left nondominant side: Secondary | ICD-10-CM | POA: Diagnosis not present

## 2021-05-27 DIAGNOSIS — Z20822 Contact with and (suspected) exposure to covid-19: Secondary | ICD-10-CM | POA: Diagnosis not present

## 2021-05-27 DIAGNOSIS — M47812 Spondylosis without myelopathy or radiculopathy, cervical region: Secondary | ICD-10-CM | POA: Diagnosis not present

## 2021-05-27 DIAGNOSIS — M4802 Spinal stenosis, cervical region: Secondary | ICD-10-CM | POA: Diagnosis not present

## 2021-05-27 DIAGNOSIS — R2681 Unsteadiness on feet: Secondary | ICD-10-CM | POA: Diagnosis not present

## 2021-05-27 DIAGNOSIS — M542 Cervicalgia: Secondary | ICD-10-CM | POA: Diagnosis not present

## 2021-05-27 DIAGNOSIS — I10 Essential (primary) hypertension: Secondary | ICD-10-CM | POA: Diagnosis not present

## 2021-05-27 DIAGNOSIS — G4733 Obstructive sleep apnea (adult) (pediatric): Secondary | ICD-10-CM | POA: Diagnosis not present

## 2021-05-27 DIAGNOSIS — R519 Headache, unspecified: Secondary | ICD-10-CM | POA: Diagnosis not present

## 2021-05-27 DIAGNOSIS — I4510 Unspecified right bundle-branch block: Secondary | ICD-10-CM | POA: Diagnosis not present

## 2021-05-27 DIAGNOSIS — Z720 Tobacco use: Secondary | ICD-10-CM | POA: Diagnosis not present

## 2021-05-27 DIAGNOSIS — I639 Cerebral infarction, unspecified: Secondary | ICD-10-CM | POA: Diagnosis not present

## 2021-05-27 DIAGNOSIS — R0683 Snoring: Secondary | ICD-10-CM | POA: Diagnosis not present

## 2021-05-27 DIAGNOSIS — I071 Rheumatic tricuspid insufficiency: Secondary | ICD-10-CM | POA: Diagnosis not present

## 2021-05-27 DIAGNOSIS — R001 Bradycardia, unspecified: Secondary | ICD-10-CM | POA: Diagnosis not present

## 2021-05-27 DIAGNOSIS — Z79899 Other long term (current) drug therapy: Secondary | ICD-10-CM | POA: Diagnosis not present

## 2021-05-27 DIAGNOSIS — J329 Chronic sinusitis, unspecified: Secondary | ICD-10-CM | POA: Diagnosis not present

## 2021-05-28 DIAGNOSIS — I639 Cerebral infarction, unspecified: Secondary | ICD-10-CM | POA: Diagnosis not present

## 2021-06-01 DIAGNOSIS — R1012 Left upper quadrant pain: Secondary | ICD-10-CM | POA: Diagnosis not present

## 2021-06-01 DIAGNOSIS — R531 Weakness: Secondary | ICD-10-CM | POA: Diagnosis not present

## 2021-06-01 DIAGNOSIS — R61 Generalized hyperhidrosis: Secondary | ICD-10-CM | POA: Diagnosis not present

## 2021-06-01 DIAGNOSIS — R519 Headache, unspecified: Secondary | ICD-10-CM | POA: Diagnosis not present

## 2021-06-01 DIAGNOSIS — K5730 Diverticulosis of large intestine without perforation or abscess without bleeding: Secondary | ICD-10-CM | POA: Diagnosis not present

## 2021-06-01 DIAGNOSIS — I10 Essential (primary) hypertension: Secondary | ICD-10-CM | POA: Diagnosis not present

## 2021-06-01 DIAGNOSIS — F17210 Nicotine dependence, cigarettes, uncomplicated: Secondary | ICD-10-CM | POA: Diagnosis not present

## 2021-06-01 DIAGNOSIS — R002 Palpitations: Secondary | ICD-10-CM | POA: Diagnosis not present

## 2021-06-01 DIAGNOSIS — R5383 Other fatigue: Secondary | ICD-10-CM | POA: Diagnosis not present

## 2021-06-01 DIAGNOSIS — Z8673 Personal history of transient ischemic attack (TIA), and cerebral infarction without residual deficits: Secondary | ICD-10-CM | POA: Diagnosis not present

## 2021-06-01 DIAGNOSIS — R509 Fever, unspecified: Secondary | ICD-10-CM | POA: Diagnosis not present

## 2021-06-01 DIAGNOSIS — Z8249 Family history of ischemic heart disease and other diseases of the circulatory system: Secondary | ICD-10-CM | POA: Diagnosis not present

## 2021-06-01 DIAGNOSIS — I4510 Unspecified right bundle-branch block: Secondary | ICD-10-CM | POA: Diagnosis not present

## 2021-06-01 DIAGNOSIS — K5792 Diverticulitis of intestine, part unspecified, without perforation or abscess without bleeding: Secondary | ICD-10-CM | POA: Diagnosis not present

## 2021-06-06 DIAGNOSIS — Z1211 Encounter for screening for malignant neoplasm of colon: Secondary | ICD-10-CM | POA: Diagnosis not present

## 2021-06-06 DIAGNOSIS — I639 Cerebral infarction, unspecified: Secondary | ICD-10-CM | POA: Diagnosis not present

## 2021-06-06 DIAGNOSIS — I10 Essential (primary) hypertension: Secondary | ICD-10-CM | POA: Diagnosis not present

## 2021-06-06 DIAGNOSIS — G4700 Insomnia, unspecified: Secondary | ICD-10-CM | POA: Diagnosis not present

## 2021-06-06 DIAGNOSIS — R0683 Snoring: Secondary | ICD-10-CM | POA: Diagnosis not present

## 2021-06-06 DIAGNOSIS — I4510 Unspecified right bundle-branch block: Secondary | ICD-10-CM | POA: Diagnosis not present

## 2021-06-09 DIAGNOSIS — G4700 Insomnia, unspecified: Secondary | ICD-10-CM | POA: Insufficient documentation

## 2021-07-03 DIAGNOSIS — I10 Essential (primary) hypertension: Secondary | ICD-10-CM | POA: Diagnosis not present

## 2021-07-03 DIAGNOSIS — Z7185 Encounter for immunization safety counseling: Secondary | ICD-10-CM | POA: Diagnosis not present

## 2021-07-03 DIAGNOSIS — F17200 Nicotine dependence, unspecified, uncomplicated: Secondary | ICD-10-CM | POA: Diagnosis not present

## 2021-07-03 DIAGNOSIS — I639 Cerebral infarction, unspecified: Secondary | ICD-10-CM | POA: Diagnosis not present

## 2021-07-03 DIAGNOSIS — Z23 Encounter for immunization: Secondary | ICD-10-CM | POA: Diagnosis not present

## 2021-07-09 DIAGNOSIS — F17210 Nicotine dependence, cigarettes, uncomplicated: Secondary | ICD-10-CM | POA: Diagnosis not present

## 2021-07-09 DIAGNOSIS — I10 Essential (primary) hypertension: Secondary | ICD-10-CM | POA: Diagnosis not present

## 2021-07-09 DIAGNOSIS — R059 Cough, unspecified: Secondary | ICD-10-CM | POA: Diagnosis not present

## 2021-07-09 DIAGNOSIS — Z7982 Long term (current) use of aspirin: Secondary | ICD-10-CM | POA: Diagnosis not present

## 2021-07-09 DIAGNOSIS — R1032 Left lower quadrant pain: Secondary | ICD-10-CM | POA: Diagnosis not present

## 2021-07-09 DIAGNOSIS — R110 Nausea: Secondary | ICD-10-CM | POA: Diagnosis not present

## 2021-07-09 DIAGNOSIS — U071 COVID-19: Secondary | ICD-10-CM | POA: Diagnosis not present

## 2021-07-09 DIAGNOSIS — K5792 Diverticulitis of intestine, part unspecified, without perforation or abscess without bleeding: Secondary | ICD-10-CM | POA: Diagnosis not present

## 2021-07-09 DIAGNOSIS — Z28310 Unvaccinated for covid-19: Secondary | ICD-10-CM | POA: Diagnosis not present

## 2021-07-10 DIAGNOSIS — U071 COVID-19: Secondary | ICD-10-CM | POA: Diagnosis not present

## 2021-07-18 DIAGNOSIS — F32A Depression, unspecified: Secondary | ICD-10-CM | POA: Diagnosis not present

## 2021-07-18 DIAGNOSIS — R110 Nausea: Secondary | ICD-10-CM | POA: Diagnosis not present

## 2021-07-18 DIAGNOSIS — U071 COVID-19: Secondary | ICD-10-CM | POA: Diagnosis not present

## 2021-07-31 DIAGNOSIS — G44209 Tension-type headache, unspecified, not intractable: Secondary | ICD-10-CM | POA: Diagnosis not present

## 2021-07-31 DIAGNOSIS — I63511 Cerebral infarction due to unspecified occlusion or stenosis of right middle cerebral artery: Secondary | ICD-10-CM | POA: Diagnosis not present

## 2021-08-01 DIAGNOSIS — I639 Cerebral infarction, unspecified: Secondary | ICD-10-CM | POA: Diagnosis not present

## 2021-08-01 DIAGNOSIS — Z789 Other specified health status: Secondary | ICD-10-CM | POA: Diagnosis not present

## 2021-08-04 DIAGNOSIS — R519 Headache, unspecified: Secondary | ICD-10-CM | POA: Diagnosis not present

## 2021-08-04 DIAGNOSIS — I10 Essential (primary) hypertension: Secondary | ICD-10-CM | POA: Diagnosis not present

## 2021-08-04 DIAGNOSIS — G4700 Insomnia, unspecified: Secondary | ICD-10-CM | POA: Diagnosis not present

## 2021-08-04 DIAGNOSIS — F32A Depression, unspecified: Secondary | ICD-10-CM | POA: Diagnosis not present

## 2021-08-05 DIAGNOSIS — R519 Headache, unspecified: Secondary | ICD-10-CM | POA: Insufficient documentation

## 2021-08-19 DIAGNOSIS — R110 Nausea: Secondary | ICD-10-CM | POA: Diagnosis not present

## 2021-08-19 DIAGNOSIS — R5383 Other fatigue: Secondary | ICD-10-CM | POA: Diagnosis not present

## 2021-08-19 DIAGNOSIS — Z20822 Contact with and (suspected) exposure to covid-19: Secondary | ICD-10-CM | POA: Diagnosis not present

## 2021-08-19 DIAGNOSIS — R002 Palpitations: Secondary | ICD-10-CM | POA: Diagnosis not present

## 2021-08-19 DIAGNOSIS — Z79899 Other long term (current) drug therapy: Secondary | ICD-10-CM | POA: Diagnosis not present

## 2021-08-19 DIAGNOSIS — I4510 Unspecified right bundle-branch block: Secondary | ICD-10-CM | POA: Diagnosis not present

## 2021-08-19 DIAGNOSIS — I69351 Hemiplegia and hemiparesis following cerebral infarction affecting right dominant side: Secondary | ICD-10-CM | POA: Diagnosis not present

## 2021-08-19 DIAGNOSIS — R9431 Abnormal electrocardiogram [ECG] [EKG]: Secondary | ICD-10-CM | POA: Diagnosis not present

## 2021-08-19 DIAGNOSIS — Z7982 Long term (current) use of aspirin: Secondary | ICD-10-CM | POA: Diagnosis not present

## 2021-08-19 DIAGNOSIS — R519 Headache, unspecified: Secondary | ICD-10-CM | POA: Diagnosis not present

## 2021-08-19 DIAGNOSIS — R5381 Other malaise: Secondary | ICD-10-CM | POA: Diagnosis not present

## 2021-08-19 DIAGNOSIS — M79602 Pain in left arm: Secondary | ICD-10-CM | POA: Diagnosis not present

## 2021-08-19 DIAGNOSIS — R251 Tremor, unspecified: Secondary | ICD-10-CM | POA: Diagnosis not present

## 2021-08-19 DIAGNOSIS — I491 Atrial premature depolarization: Secondary | ICD-10-CM | POA: Diagnosis not present

## 2021-08-19 DIAGNOSIS — R079 Chest pain, unspecified: Secondary | ICD-10-CM | POA: Diagnosis not present

## 2021-08-19 DIAGNOSIS — I10 Essential (primary) hypertension: Secondary | ICD-10-CM | POA: Diagnosis not present

## 2021-08-19 DIAGNOSIS — Z87891 Personal history of nicotine dependence: Secondary | ICD-10-CM | POA: Diagnosis not present

## 2021-08-21 DIAGNOSIS — F32A Depression, unspecified: Secondary | ICD-10-CM | POA: Diagnosis not present

## 2021-08-21 DIAGNOSIS — R519 Headache, unspecified: Secondary | ICD-10-CM | POA: Diagnosis not present

## 2021-08-21 DIAGNOSIS — R5383 Other fatigue: Secondary | ICD-10-CM | POA: Diagnosis not present

## 2021-08-26 DIAGNOSIS — I639 Cerebral infarction, unspecified: Secondary | ICD-10-CM | POA: Diagnosis not present

## 2021-09-02 DIAGNOSIS — Z79899 Other long term (current) drug therapy: Secondary | ICD-10-CM | POA: Diagnosis not present

## 2021-09-02 DIAGNOSIS — I498 Other specified cardiac arrhythmias: Secondary | ICD-10-CM | POA: Diagnosis not present

## 2021-09-02 DIAGNOSIS — I10 Essential (primary) hypertension: Secondary | ICD-10-CM | POA: Diagnosis not present

## 2021-09-02 DIAGNOSIS — Z20822 Contact with and (suspected) exposure to covid-19: Secondary | ICD-10-CM | POA: Diagnosis not present

## 2021-09-02 DIAGNOSIS — R079 Chest pain, unspecified: Secondary | ICD-10-CM | POA: Diagnosis not present

## 2021-09-02 DIAGNOSIS — R519 Headache, unspecified: Secondary | ICD-10-CM | POA: Diagnosis not present

## 2021-09-02 DIAGNOSIS — Z8673 Personal history of transient ischemic attack (TIA), and cerebral infarction without residual deficits: Secondary | ICD-10-CM | POA: Diagnosis not present

## 2021-09-02 DIAGNOSIS — I4510 Unspecified right bundle-branch block: Secondary | ICD-10-CM | POA: Diagnosis not present

## 2021-09-02 DIAGNOSIS — R251 Tremor, unspecified: Secondary | ICD-10-CM | POA: Diagnosis not present

## 2021-09-02 DIAGNOSIS — R110 Nausea: Secondary | ICD-10-CM | POA: Diagnosis not present

## 2021-09-02 DIAGNOSIS — Z7982 Long term (current) use of aspirin: Secondary | ICD-10-CM | POA: Diagnosis not present

## 2021-09-03 DIAGNOSIS — R519 Headache, unspecified: Secondary | ICD-10-CM | POA: Diagnosis not present

## 2021-09-08 DIAGNOSIS — Z8673 Personal history of transient ischemic attack (TIA), and cerebral infarction without residual deficits: Secondary | ICD-10-CM | POA: Diagnosis not present

## 2021-09-08 DIAGNOSIS — R42 Dizziness and giddiness: Secondary | ICD-10-CM | POA: Diagnosis not present

## 2021-09-08 DIAGNOSIS — Z7982 Long term (current) use of aspirin: Secondary | ICD-10-CM | POA: Diagnosis not present

## 2021-09-08 DIAGNOSIS — Z87891 Personal history of nicotine dependence: Secondary | ICD-10-CM | POA: Diagnosis not present

## 2021-09-08 DIAGNOSIS — R519 Headache, unspecified: Secondary | ICD-10-CM | POA: Diagnosis not present

## 2021-09-08 DIAGNOSIS — R251 Tremor, unspecified: Secondary | ICD-10-CM | POA: Diagnosis not present

## 2021-09-08 DIAGNOSIS — I10 Essential (primary) hypertension: Secondary | ICD-10-CM | POA: Diagnosis not present

## 2021-09-08 DIAGNOSIS — R079 Chest pain, unspecified: Secondary | ICD-10-CM | POA: Diagnosis not present

## 2021-09-08 DIAGNOSIS — Z79899 Other long term (current) drug therapy: Secondary | ICD-10-CM | POA: Diagnosis not present

## 2021-09-08 DIAGNOSIS — R110 Nausea: Secondary | ICD-10-CM | POA: Diagnosis not present

## 2021-09-08 DIAGNOSIS — M542 Cervicalgia: Secondary | ICD-10-CM | POA: Diagnosis not present

## 2021-09-16 DIAGNOSIS — I639 Cerebral infarction, unspecified: Secondary | ICD-10-CM | POA: Diagnosis not present

## 2021-09-22 DIAGNOSIS — I10 Essential (primary) hypertension: Secondary | ICD-10-CM | POA: Diagnosis not present

## 2021-09-22 DIAGNOSIS — F32A Depression, unspecified: Secondary | ICD-10-CM | POA: Diagnosis not present

## 2021-09-22 DIAGNOSIS — R519 Headache, unspecified: Secondary | ICD-10-CM | POA: Diagnosis not present

## 2021-09-23 DIAGNOSIS — Z7409 Other reduced mobility: Secondary | ICD-10-CM | POA: Diagnosis not present

## 2021-09-23 DIAGNOSIS — I639 Cerebral infarction, unspecified: Secondary | ICD-10-CM | POA: Diagnosis not present

## 2021-10-08 DIAGNOSIS — T7840XA Allergy, unspecified, initial encounter: Secondary | ICD-10-CM | POA: Diagnosis not present

## 2021-10-08 DIAGNOSIS — R519 Headache, unspecified: Secondary | ICD-10-CM | POA: Diagnosis not present

## 2021-10-08 DIAGNOSIS — I10 Essential (primary) hypertension: Secondary | ICD-10-CM | POA: Diagnosis not present

## 2021-10-08 DIAGNOSIS — K029 Dental caries, unspecified: Secondary | ICD-10-CM | POA: Diagnosis not present

## 2021-10-10 DIAGNOSIS — Z7409 Other reduced mobility: Secondary | ICD-10-CM | POA: Diagnosis not present

## 2021-10-10 DIAGNOSIS — I639 Cerebral infarction, unspecified: Secondary | ICD-10-CM | POA: Diagnosis not present

## 2021-10-13 DIAGNOSIS — F4323 Adjustment disorder with mixed anxiety and depressed mood: Secondary | ICD-10-CM | POA: Insufficient documentation

## 2021-10-17 DIAGNOSIS — Z7409 Other reduced mobility: Secondary | ICD-10-CM | POA: Diagnosis not present

## 2021-10-17 DIAGNOSIS — I639 Cerebral infarction, unspecified: Secondary | ICD-10-CM | POA: Diagnosis not present

## 2021-10-21 DIAGNOSIS — Z7409 Other reduced mobility: Secondary | ICD-10-CM | POA: Diagnosis not present

## 2021-10-21 DIAGNOSIS — I639 Cerebral infarction, unspecified: Secondary | ICD-10-CM | POA: Diagnosis not present

## 2021-11-27 DIAGNOSIS — Z8673 Personal history of transient ischemic attack (TIA), and cerebral infarction without residual deficits: Secondary | ICD-10-CM | POA: Diagnosis not present

## 2021-11-27 DIAGNOSIS — R292 Abnormal reflex: Secondary | ICD-10-CM | POA: Diagnosis not present

## 2021-11-27 DIAGNOSIS — E785 Hyperlipidemia, unspecified: Secondary | ICD-10-CM | POA: Diagnosis not present

## 2021-11-27 DIAGNOSIS — Z7982 Long term (current) use of aspirin: Secondary | ICD-10-CM | POA: Diagnosis not present

## 2021-11-27 DIAGNOSIS — F32A Depression, unspecified: Secondary | ICD-10-CM | POA: Diagnosis not present

## 2021-11-27 DIAGNOSIS — Z79899 Other long term (current) drug therapy: Secondary | ICD-10-CM | POA: Diagnosis not present

## 2021-11-27 DIAGNOSIS — K029 Dental caries, unspecified: Secondary | ICD-10-CM | POA: Diagnosis not present

## 2021-11-27 DIAGNOSIS — Z87891 Personal history of nicotine dependence: Secondary | ICD-10-CM | POA: Diagnosis not present

## 2021-11-27 DIAGNOSIS — K047 Periapical abscess without sinus: Secondary | ICD-10-CM | POA: Diagnosis not present

## 2021-11-27 DIAGNOSIS — D649 Anemia, unspecified: Secondary | ICD-10-CM | POA: Diagnosis not present

## 2021-11-27 DIAGNOSIS — I10 Essential (primary) hypertension: Secondary | ICD-10-CM | POA: Diagnosis not present

## 2021-11-27 DIAGNOSIS — K0530 Chronic periodontitis, unspecified: Secondary | ICD-10-CM | POA: Diagnosis not present

## 2021-11-27 DIAGNOSIS — S025XXA Fracture of tooth (traumatic), initial encounter for closed fracture: Secondary | ICD-10-CM | POA: Diagnosis not present

## 2021-12-16 DIAGNOSIS — Z87891 Personal history of nicotine dependence: Secondary | ICD-10-CM | POA: Diagnosis not present

## 2021-12-16 DIAGNOSIS — I10 Essential (primary) hypertension: Secondary | ICD-10-CM | POA: Diagnosis not present

## 2021-12-16 DIAGNOSIS — Z79899 Other long term (current) drug therapy: Secondary | ICD-10-CM | POA: Diagnosis not present

## 2021-12-16 DIAGNOSIS — Z7982 Long term (current) use of aspirin: Secondary | ICD-10-CM | POA: Diagnosis not present

## 2021-12-16 DIAGNOSIS — Z8673 Personal history of transient ischemic attack (TIA), and cerebral infarction without residual deficits: Secondary | ICD-10-CM | POA: Diagnosis not present

## 2021-12-16 DIAGNOSIS — R519 Headache, unspecified: Secondary | ICD-10-CM | POA: Diagnosis not present

## 2021-12-16 DIAGNOSIS — G4733 Obstructive sleep apnea (adult) (pediatric): Secondary | ICD-10-CM | POA: Diagnosis not present

## 2021-12-16 DIAGNOSIS — G5620 Lesion of ulnar nerve, unspecified upper limb: Secondary | ICD-10-CM | POA: Diagnosis not present

## 2022-01-08 DIAGNOSIS — G4700 Insomnia, unspecified: Secondary | ICD-10-CM | POA: Diagnosis not present

## 2022-01-08 DIAGNOSIS — I639 Cerebral infarction, unspecified: Secondary | ICD-10-CM | POA: Diagnosis not present

## 2022-01-08 DIAGNOSIS — R519 Headache, unspecified: Secondary | ICD-10-CM | POA: Diagnosis not present

## 2022-01-08 DIAGNOSIS — I10 Essential (primary) hypertension: Secondary | ICD-10-CM | POA: Diagnosis not present

## 2022-01-08 DIAGNOSIS — Z23 Encounter for immunization: Secondary | ICD-10-CM | POA: Diagnosis not present

## 2022-02-12 DIAGNOSIS — G3184 Mild cognitive impairment, so stated: Secondary | ICD-10-CM | POA: Diagnosis not present

## 2022-02-12 DIAGNOSIS — I69815 Cognitive social or emotional deficit following other cerebrovascular disease: Secondary | ICD-10-CM | POA: Diagnosis not present

## 2022-04-10 DIAGNOSIS — E785 Hyperlipidemia, unspecified: Secondary | ICD-10-CM | POA: Insufficient documentation

## 2022-10-28 ENCOUNTER — Encounter: Payer: Self-pay | Admitting: *Deleted

## 2023-01-25 ENCOUNTER — Telehealth: Payer: Self-pay

## 2023-01-25 ENCOUNTER — Other Ambulatory Visit: Payer: Self-pay

## 2023-01-25 DIAGNOSIS — Z1211 Encounter for screening for malignant neoplasm of colon: Secondary | ICD-10-CM

## 2023-01-25 MED ORDER — NA SULFATE-K SULFATE-MG SULF 17.5-3.13-1.6 GM/177ML PO SOLN: 1.0000 | Freq: Once | ORAL | 0 refills | Status: AC

## 2023-01-25 NOTE — Telephone Encounter (Signed)
Gastroenterology Pre-Procedure Review  Request Date: 03/22/23 Requesting Physician: Dr. Servando Snare  PATIENT REVIEW QUESTIONS: The patient responded to the following health history questions as indicated:    1. Are you having any GI issues? no 2. Do you have a personal history of Polyps? no 3. Do you have a family history of Colon Cancer or Polyps? no 4. Diabetes Mellitus? no 5. Joint replacements in the past 12 months?no 6. Major health problems in the past 3 months?no 7. Any artificial heart valves, MVP, or defibrillator?no    MEDICATIONS & ALLERGIES:    Patient reports the following regarding taking any anticoagulation/antiplatelet therapy:   Plavix, Coumadin, Eliquis, Xarelto, Lovenox, Pradaxa, Brilinta, or Effient? no Aspirin? no  Patient confirms/reports the following medications:  Current Outpatient Medications  Medication Sig Dispense Refill   loratadine (CLARITIN) 10 MG tablet Take 1 tablet (10 mg total) by mouth daily. 30 tablet 0   No current facility-administered medications for this visit.    Patient confirms/reports the following allergies:  No Known Allergies  No orders of the defined types were placed in this encounter.   AUTHORIZATION INFORMATION Primary Insurance: 1D#: Group #:  Secondary Insurance: 1D#: Group #:  SCHEDULE INFORMATION: Date: 03/22/23 Time: Location: [facility]

## 2023-01-25 NOTE — Telephone Encounter (Signed)
Pt's wife, Shakeel Drzewiecki calling to schedule husband's screening colonoscopy. Pt gave permission for [place] to schedule with wife. Please call.

## 2023-01-27 ENCOUNTER — Telehealth: Payer: Self-pay | Admitting: Gastroenterology

## 2023-01-27 NOTE — Telephone Encounter (Signed)
PT left message to cancel colonoscopy for 03/22/2023 will call back to reschedule

## 2023-03-22 ENCOUNTER — Ambulatory Visit: Admit: 2023-03-22 | Payer: Medicare Other | Admitting: Gastroenterology

## 2023-03-22 SURGERY — COLONOSCOPY WITH PROPOFOL
Anesthesia: General
